# Patient Record
Sex: Female | Born: 2000 | Race: White | Hispanic: No | Marital: Single | Smoking: Current every day smoker
Health system: Southern US, Community
[De-identification: ages and names within clinical notes are randomized; demographics above are authoritative.]

## PROBLEM LIST (undated history)

## (undated) DIAGNOSIS — R55 Syncope and collapse: Secondary | ICD-10-CM

---
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
## 2019-01-26 ENCOUNTER — Emergency Department: Payer: Managed Care, Other (non HMO)

## 2019-01-26 ENCOUNTER — Other Ambulatory Visit: Payer: Self-pay

## 2019-01-26 ENCOUNTER — Emergency Department
Admission: EM | Admit: 2019-01-26 | Discharge: 2019-01-26 | Disposition: A | Discharge status: Home / Self Care | Payer: Managed Care, Other (non HMO) | Attending: Student in an Organized Health Care Education/Training Program | Admitting: Student in an Organized Health Care Education/Training Program

## 2019-01-26 DIAGNOSIS — R55 Syncope and collapse: Secondary | ICD-10-CM

## 2019-01-26 LAB — COMPREHENSIVE METABOLIC PANEL
ALBUMIN: 4.5 g/dL (ref 3.5–5.0)
ALT: 11 U/L (ref 0–44)
AST: 16 U/L (ref 15–41)
Alkaline Phosphatase: 86 U/L (ref 38–126)
Anion gap: 8 (ref 5–15)
BUN: 14 mg/dL (ref 6–20)
CO2: 22 mmol/L (ref 22–32)
Calcium: 9.5 mg/dL (ref 8.9–10.3)
Chloride: 107 mmol/L (ref 98–111)
Creatinine, Ser: 0.72 mg/dL (ref 0.44–1.00)
GFR calc Af Amer: >60 mL/min (ref >60)
GFR calc non Af Amer: >60 mL/min (ref >60)
GLUCOSE: 95 mg/dL (ref 70–99)
Potassium: 3.9 mmol/L (ref 3.5–5.1)
Sodium: 137 mmol/L (ref 135–145)
Total Bilirubin: 1.1 mg/dL (ref 0.3–1.2)
Total Protein: 7.6 g/dL (ref 6.5–8.1)

## 2019-01-26 LAB — CBC
HCT: 40.3 % (ref 36.0–46.0)
Hemoglobin: 14 g/dL (ref 12.0–15.0)
MCH: 29.4 pg (ref 26.0–34.0)
MCHC: 34.7 g/dL (ref 30.0–36.0)
MCV: 84.7 fL (ref 80.0–100.0)
Platelets: 301 10*3/uL (ref 150–400)
RBC: 4.76 MIL/uL (ref 3.87–5.11)
RDW: 11.9 % (ref 11.5–15.5)
WBC: 7.5 10*3/uL (ref 4.0–10.5)
nRBC: 0 % (ref 0.0–0.2)

## 2019-01-26 LAB — HCG, QUANTITATIVE, PREGNANCY: hCG, Beta Chain, Quant, S: <1 m[IU]/mL (ref <5)

## 2019-01-26 LAB — FIBRIN DERIVATIVES D-DIMER (ARMC ONLY): Fibrin derivatives D-dimer (ARMC): 161.85 ng/mL (FEU) (ref 0.00–499.00)

## 2019-01-26 IMAGING — CT CT HEAD WO/W CM
4 of 5 series · 15 of 47 positions shown, 17 images · IV contrast (omnipaque)
Comparison: None.

CLINICAL DATA: Multiple syncopal episodes in the past 24 hours.
Associated visual and hearing disturbance.

EXAM:
CT HEAD WITHOUT AND WITH CONTRAST
TECHNIQUE: Contiguous axial images were obtained from the base of the skull
through the vertex without and with intravenous contrast
CONTRAST:  75mL OMNIPAQUE IOHEXOL 300 MG/ML  SOLN

[Series 2: head wo · axial · 0.40mm/px · z∈[+54,+134]mm · 4 of 28 slices shown]
[im 6/28  brain]
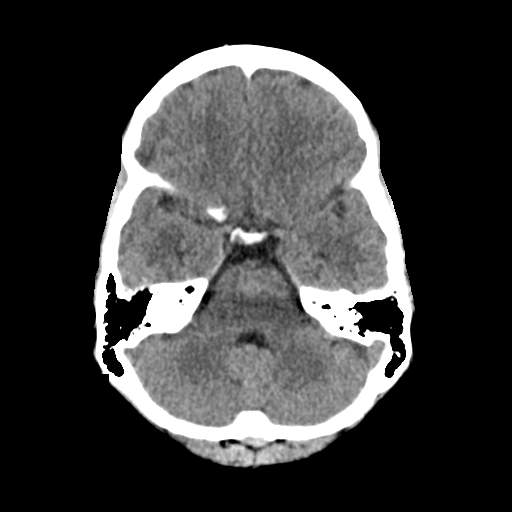
[im 11/28  brain]
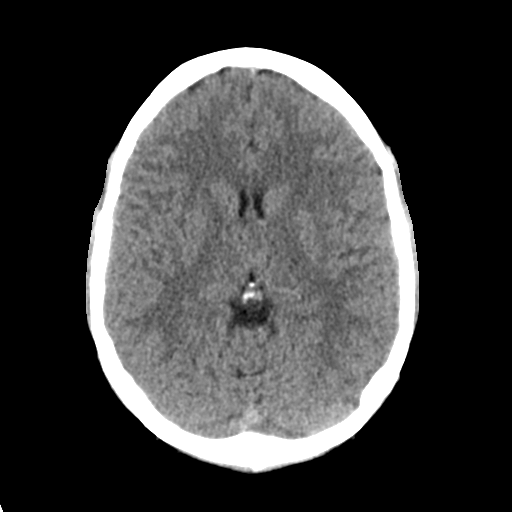
[im 17/28  brain]
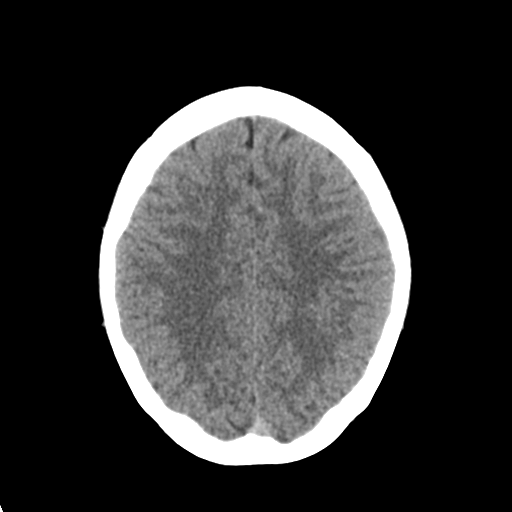
[im 22/28  brain]
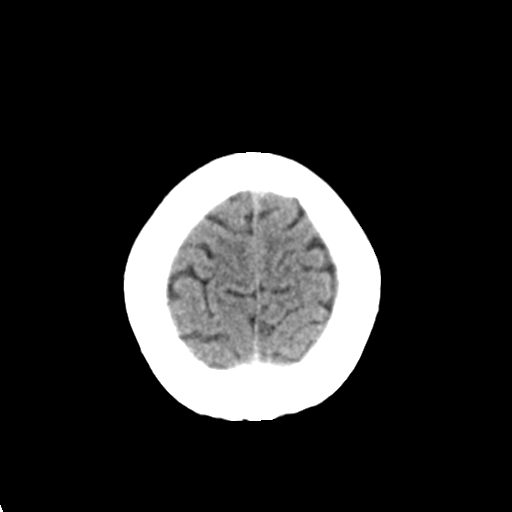

[Series 4: head w · axial · 0.40mm/px · z∈[+49,+139]mm · 5 of 28 slices shown, 7 images]
[im 5/28  brain]
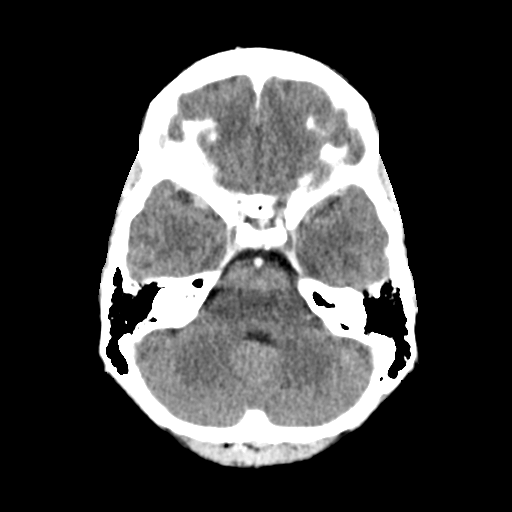
[im 5/28  bone]
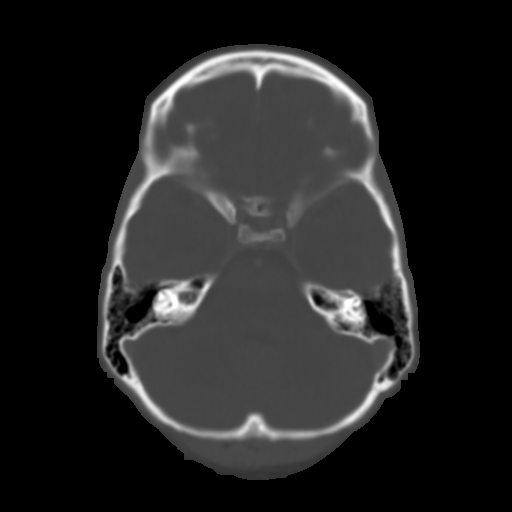
[im 10/28  brain]
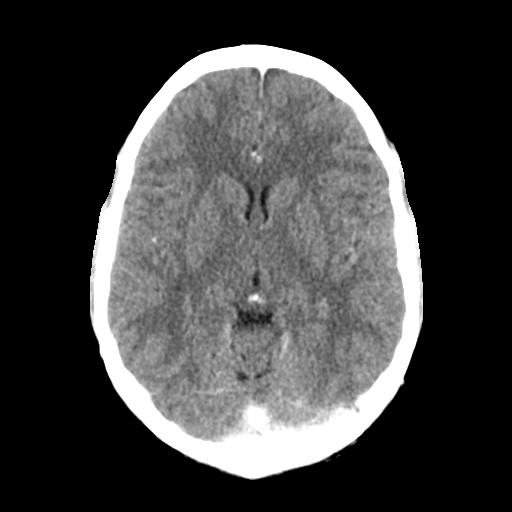
[im 14/28  brain]
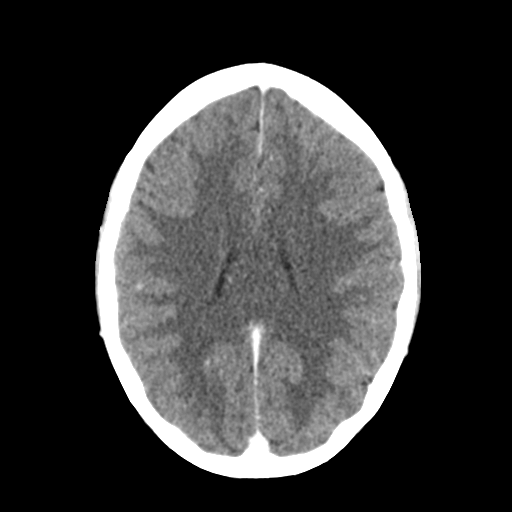
[im 19/28  brain]
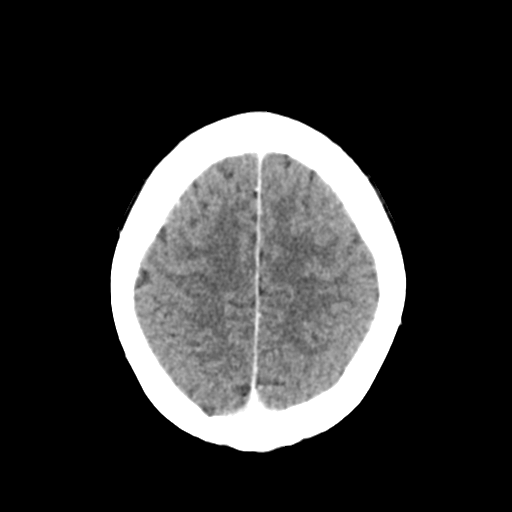
[im 23/28  brain]
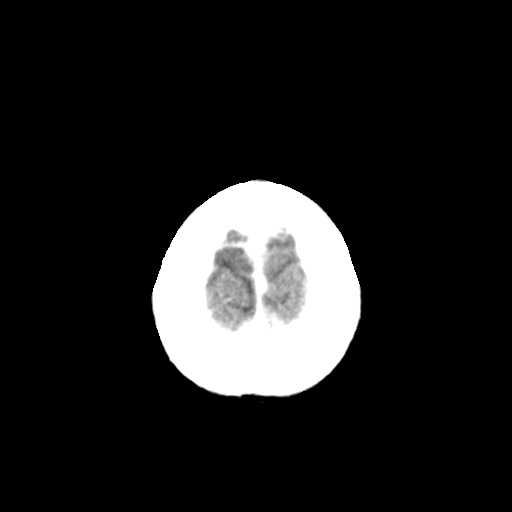
[im 23/28  bone]
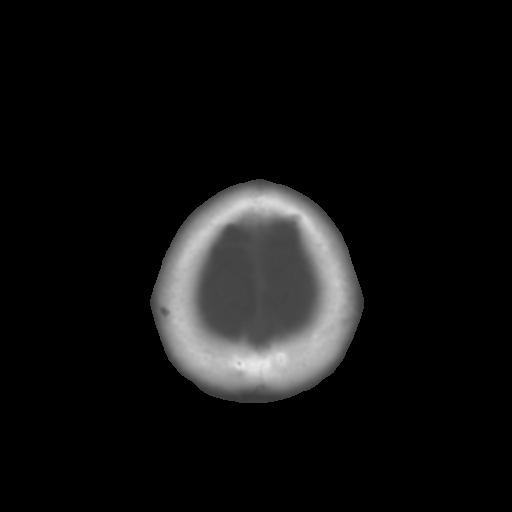

[Series 5: coronal soft tissue · coronal · 0.29mm/px · 3 of 65 slices shown]
[im 22/65  brain]
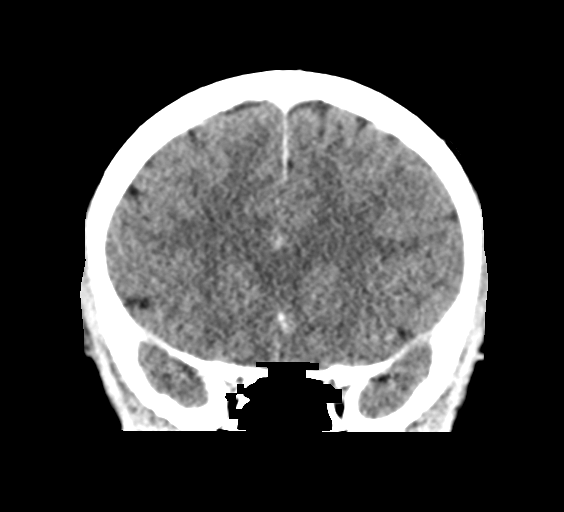
[im 29/65  brain]
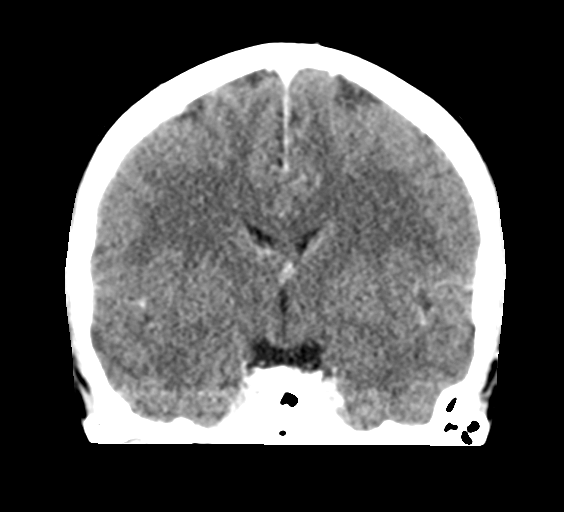
[im 36/65  brain]
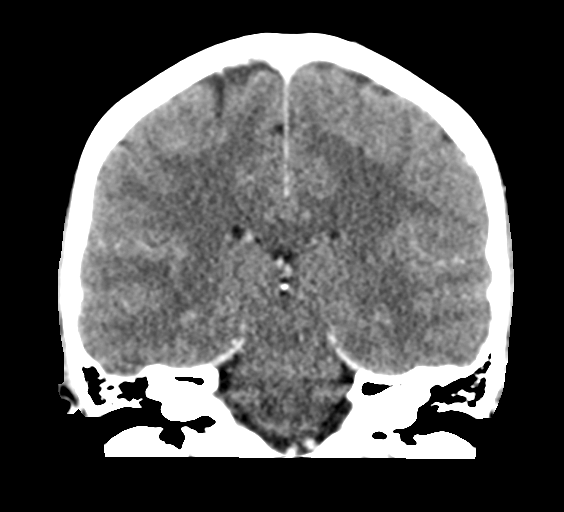

[Series 6: sagittal soft tissue · sagittal · 0.30mm/px · 3 of 50 slices shown]
[im 17/50  brain]
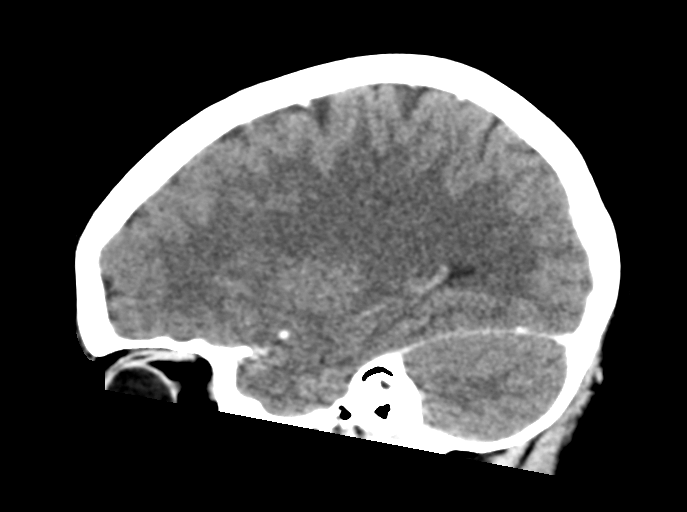
[im 25/50  brain]
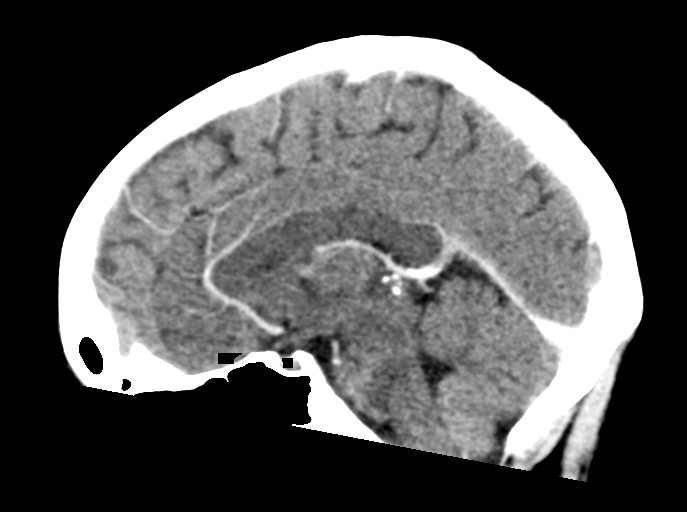
[im 33/50  brain]
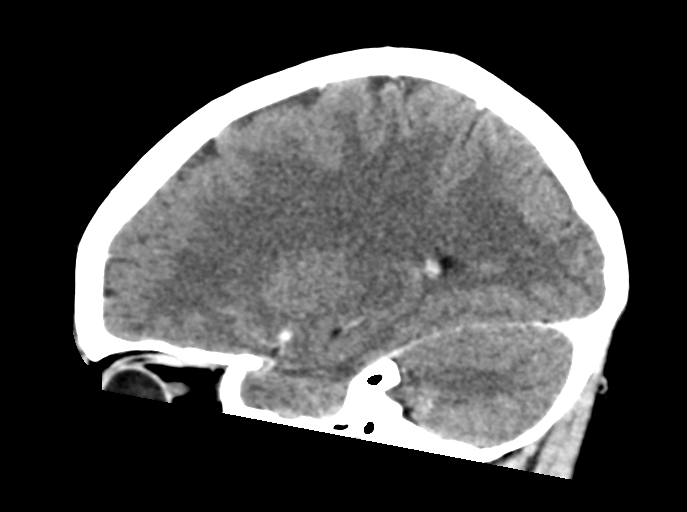

[15 of 47 positions shown; findings below may reference images not displayed]

FINDINGS: Brain: There is no evidence of acute infarct, intracranial
hemorrhage, mass, midline shift, or extra-axial fluid collection.
The ventricles and sulci are normal. No abnormal enhancement is
identified.

Vascular: Major dural venous sinuses and large arteries at the base
of the brain are grossly patent.

Skull: No fracture or focal osseous lesion.

Sinuses/Orbits: Visualized paranasal sinuses and mastoid air cells
are clear. Visualized orbits are unremarkable.

Other: None.
IMPRESSION: Unremarkable head CT.

## 2019-01-26 MED ORDER — IOHEXOL 300 MG/ML  SOLN
75.0000 mL | Freq: Once | INTRAMUSCULAR | Status: AC | PRN
  Administered 2019-01-26: 75 mL via INTRAVENOUS

## 2019-01-26 NOTE — ED Notes (Signed)
Pulse rate documented at 1200 is inaccurate-spo2 sensor has not been properly on finger at all times

## 2019-01-26 NOTE — ED Notes (Signed)
Pt states that she is depressed because her boyfriend, her only friend, broke up with her. Pt is from [employer].

## 2019-01-26 NOTE — ED Notes (Signed)
Patient transported to CT 

## 2019-01-26 NOTE — ED Triage Notes (Signed)
Pt arrived via [facility] from [facility]. with syncopal episode 4 times in the last 24 hours. Pt admits to being stressed lately and whenever it happens she has vision and hearing issues and then she wakes up. Pt is A&O x4.

## 2019-01-26 NOTE — ED Notes (Signed)
Pt alert and oriented, able to speak with clear speech. Pt recolects each syncopal episode. States that she is usually standing when this happends (not going from sit to stand) and she loses hearing and vision and her body goes to "jello" but that she falls "gracefully" and has not hit head

## 2019-01-26 NOTE — ED Provider Notes (Signed)
[facility] Emergency Department Provider Note    First MD Initiated Contact with Patient 01/26/19 1009     (approximate)  I have reviewed the triage vital signs and the nursing notes.   HISTORY  Chief Complaint Loss of Consciousness    HPI Katrina Butler is a 18 y.o. female presents the ER for evaluation of multiple fainting spells over the past 48 hours.  States she is had several episodes where she was otherwise feeling fine but then started feeling claustrophobic and weak and "weird feelings ".  States that she feels like her mind is racing and she feels very anxious.  She would not simply wake up after having fainting spell for on known duration with bystanders looking down at her.  Did not bite her tongue.  No loss of bladder control.  No reported shaking or seizure-like activity.  States that she has been getting headaches.  Does have a history of migraines but feel that they are happening more frequently.  Does have family history of neuro tumors.  She is on birth control.  Denies any chest pain.    History reviewed. No pertinent past medical history. History reviewed. No pertinent family history. History reviewed. No pertinent surgical history. There are no active problems to display for this patient.     Prior to Admission medications   Not on File    Allergies Amoxapine and related and Fruit & vegetable daily [nutritional supplements]    Social History Social History   Tobacco Use  . Smoking status: Not on file  Substance Use Topics  . Alcohol use: Not on file  . Drug use: Not on file    Review of Systems Patient denies headaches, rhinorrhea, blurry vision, numbness, shortness of breath, chest pain, edema, cough, abdominal pain, nausea, vomiting, diarrhea, dysuria, fevers, rashes or hallucinations unless otherwise stated above in HPI. ____________________________________________   PHYSICAL EXAM:  VITAL SIGNS: Vitals:   01/26/19 1130 01/26/19 1200  BP: 110/68 117/88  Pulse: (!) 53 (!) 36  Resp: (!) 9 15  Temp:    SpO2: 100% 95%    Constitutional: Alert and oriented. Anxious appearing  Eyes: Conjunctivae are normal.  Head: Atraumatic. Nose: No congestion/rhinnorhea. Mouth/Throat: Mucous membranes are moist.   Neck: No stridor. Painless ROM.  Cardiovascular: Normal rate, regular rhythm. Grossly normal heart sounds.  Good peripheral circulation. Respiratory: Normal respiratory effort.  No retractions. Lungs CTAB. Gastrointestinal: Soft and nontender. No distention. No abdominal bruits. No CVA tenderness. Genitourinary:  Musculoskeletal: No lower extremity tenderness nor edema.  No joint effusions. Neurologic:  Normal speech and language. No gross focal neurologic deficits are appreciated. No facial droop Skin:  Skin is warm, dry and intact. No rash noted. Psychiatric: Mood and affect are anxious. Speech and behavior are normal.  ____________________________________________   LABS (all labs ordered are listed, but only abnormal results are displayed)  Results for orders placed or performed during the hospital encounter of 01/26/19 (from the past 24 hour(s))  Comprehensive metabolic panel     Status: None   Collection Time: 01/26/19  9:51 AM  Result Value Ref Range   Sodium 137 135 - 145 mmol/L   Potassium 3.9 3.5 - 5.1 mmol/L   Chloride 107 98 - 111 mmol/L   CO2 22 22 - 32 mmol/L   Glucose, Bld 95 70 - 99 mg/dL   BUN 14 6 - 20 mg/dL   Creatinine, Ser 0.72 0.44 - 1.00 mg/dL   Calcium 9.5 8.9 -  10.3 mg/dL   Total Protein 7.6 6.5 - 8.1 g/dL   Albumin 4.5 3.5 - 5.0 g/dL   AST 16 15 - 41 U/L   ALT 11 0 - 44 U/L   Alkaline Phosphatase 86 38 - 126 U/L   Total Bilirubin 1.1 0.3 - 1.2 mg/dL   GFR calc non Af Amer >60 >60 mL/min   GFR calc Af Amer >60 >60 mL/min   Anion gap 8 5 - 15  CBC     Status: None   Collection Time: 01/26/19  9:51 AM  Result Value Ref Range   WBC 7.5 4.0 - 10.5 K/uL    RBC 4.76 3.87 - 5.11 MIL/uL   Hemoglobin 14.0 12.0 - 15.0 g/dL   HCT 40.3 36.0 - 46.0 %   MCV 84.7 80.0 - 100.0 fL   MCH 29.4 26.0 - 34.0 pg   MCHC 34.7 30.0 - 36.0 g/dL   RDW 11.9 11.5 - 15.5 %   Platelets 301 150 - 400 K/uL   nRBC 0.0 0.0 - 0.2 %  hCG, quantitative, pregnancy     Status: None   Collection Time: 01/26/19  9:51 AM  Result Value Ref Range   hCG, Beta Chain, Quant, S <1 <5 mIU/mL  Fibrin derivatives D-Dimer ([facility] only)     Status: None   Collection Time: 01/26/19 10:32 AM  Result Value Ref Range   Fibrin derivatives D-dimer ([facility]) 161.85 0.00 - 499.00 ng/mL (FEU)   ____________________________________________  EKG My review and personal interpretation at Time: 9:47   Indication: syncope  Rate: 55  Rhythm: sinus Axis: normal Other: normal intervals, no stemi ____________________________________________  RADIOLOGY  I personally reviewed all radiographic images ordered to evaluate for the above acute complaints and reviewed radiology reports and findings.  These findings were personally discussed with the patient.  Please see medical record for radiology report.  ____________________________________________   PROCEDURES  Procedure(s) performed:  Procedures    Critical Care performed: no ____________________________________________   INITIAL IMPRESSION / ASSESSMENT AND PLAN / ED COURSE  Pertinent labs & imaging results that were available during my care of the patient were reviewed by me and considered in my medical decision making (see chart for details).   DDX: dehydration, panic attack, anemia, electrolyte abn, pe, dysrhythmia  Katrina Butler is a 18 y.o. who presents to the ED with symptoms as described above.  Patient in no acute distress.  Neuro exam is nonfocal.  Will check blood work for above differential.  The patient will be placed on continuous pulse oximetry and telemetry for monitoring.  Laboratory evaluation will be sent to evaluate for the  above complaints.     Clinical Course as of Jan 26 1317  Wed Jan 26, 2019  1313 Reassessed.  Blood work is reassuring.  CT imaging shows no evidence of mass.  Does not seem clinically consistent with seizure.  No evidence of electrolyte abnormality.  There was one documented bradycardic episode however her telemetry rate was still in the 50s to 60s.  Likely incorrect reading from pulse oximetry   [PR]  1315 Will measure orthostatics and if normal will give referral to outpatient follow-up.  Have discussed with the patient and available family all diagnostics and treatments performed thus far and all questions were answered to the best of my ability. The patient demonstrates understanding and agreement with plan.    [PR]    Clinical Course User Index [PR] Merlyn Lot, MD     As part of  my medical decision making, I reviewed the following data within the [place] notes reviewed and incorporated, Labs reviewed, notes from prior ED visits and  Controlled Substance Database   ____________________________________________   FINAL CLINICAL IMPRESSION(S) / ED DIAGNOSES  Final diagnoses:  Fainting spell      NEW MEDICATIONS STARTED DURING THIS VISIT:  New Prescriptions   No medications on file     Note:  This document was prepared using Dragon voice recognition software and may include unintentional dictation errors.    Merlyn Lot, MD 01/26/19 1318

## 2019-11-02 ENCOUNTER — Other Ambulatory Visit: Payer: Self-pay

## 2019-11-02 DIAGNOSIS — Z20822 Contact with and (suspected) exposure to covid-19: Secondary | ICD-10-CM

## 2019-11-04 LAB — NOVEL CORONAVIRUS, NAA: SARS-CoV-2, NAA: NOT DETECTED

## 2020-03-12 ENCOUNTER — Ambulatory Visit: Payer: Managed Care, Other (non HMO) | Attending: Internal Medicine

## 2020-03-12 DIAGNOSIS — Z23 Encounter for immunization: Secondary | ICD-10-CM

## 2020-03-12 NOTE — Progress Notes (Signed)
   Covid-19 Vaccination Clinic  Name:  Katrina Butler    MRN: [id number] DOB: 20-Feb-2001  03/12/2020  [name] was observed post Covid-19 immunization for 15 minutes without incident. She was provided with Vaccine Information Sheet and instruction to access the V-Safe system.   [name] was instructed to call 911 with any severe reactions post vaccine: Marland Kitchen Difficulty breathing  . Swelling of face and throat  . A fast heartbeat  . A bad rash all over body  . Dizziness and weakness   Immunizations Administered    Name Date Dose VIS Date Route   Pfizer COVID-19 Vaccine 03/12/2020  2:19 PM 0.3 mL 11/25/2019 Intramuscular   Manufacturer: [place]   Lot: [id number]   [place]: [id number]

## 2020-04-02 ENCOUNTER — Ambulatory Visit: Payer: Managed Care, Other (non HMO) | Attending: Internal Medicine

## 2020-04-02 DIAGNOSIS — Z23 Encounter for immunization: Secondary | ICD-10-CM

## 2020-04-02 NOTE — Progress Notes (Signed)
   Covid-19 Vaccination Clinic  Name:  Katrina Butler    MRN: [id number] DOB: 10/13/2001  04/02/2020  [name] was observed post Covid-19 immunization for 15 minutes without incident. She was provided with Vaccine Information Sheet and instruction to access the V-Safe system.   [name] was instructed to call 911 with any severe reactions post vaccine: Marland Kitchen Difficulty breathing  . Swelling of face and throat  . A fast heartbeat  . A bad rash all over body  . Dizziness and weakness   Immunizations Administered    Name Date Dose VIS Date Route   Pfizer COVID-19 Vaccine 04/02/2020  2:15 PM 0.3 mL 02/08/2019 Intramuscular   Manufacturer: [employer], [employer]   Lot: [id number]   [place]: [id number]

## 2021-02-17 ENCOUNTER — Emergency Department
Admission: EM | Admit: 2021-02-17 | Discharge: 2021-02-17 | Disposition: A | Discharge status: Home / Self Care | Payer: Managed Care, Other (non HMO) | Attending: Emergency Medicine | Admitting: Emergency Medicine

## 2021-02-17 ENCOUNTER — Other Ambulatory Visit: Payer: Self-pay

## 2021-02-17 ENCOUNTER — Emergency Department: Payer: Managed Care, Other (non HMO)

## 2021-02-17 ENCOUNTER — Encounter: Payer: Self-pay | Admitting: Intensive Care

## 2021-02-17 DIAGNOSIS — Z20822 Contact with and (suspected) exposure to covid-19: Secondary | ICD-10-CM | POA: Diagnosis not present

## 2021-02-17 DIAGNOSIS — R1031 Right lower quadrant pain: Secondary | ICD-10-CM | POA: Insufficient documentation

## 2021-02-17 DIAGNOSIS — R112 Nausea with vomiting, unspecified: Secondary | ICD-10-CM | POA: Insufficient documentation

## 2021-02-17 DIAGNOSIS — R1084 Generalized abdominal pain: Secondary | ICD-10-CM

## 2021-02-17 DIAGNOSIS — R197 Diarrhea, unspecified: Secondary | ICD-10-CM | POA: Diagnosis not present

## 2021-02-17 DIAGNOSIS — F17290 Nicotine dependence, other tobacco product, uncomplicated: Secondary | ICD-10-CM | POA: Insufficient documentation

## 2021-02-17 HISTORY — DX: Syncope and collapse: R55

## 2021-02-17 LAB — URINALYSIS, COMPLETE (UACMP) WITH MICROSCOPIC
Bacteria, UA: NONE SEEN
Bilirubin Urine: NEGATIVE
Glucose, UA: NEGATIVE mg/dL
Ketones, ur: 5 mg/dL — AB
Leukocytes,Ua: NEGATIVE
Nitrite: NEGATIVE
Protein, ur: NEGATIVE mg/dL
Specific Gravity, Urine: 1.025 (ref 1.005–1.030)
pH: 5 (ref 5.0–8.0)

## 2021-02-17 LAB — GASTROINTESTINAL PANEL BY PCR, STOOL (REPLACES STOOL CULTURE)

## 2021-02-17 LAB — COMPREHENSIVE METABOLIC PANEL
ALT: 26 U/L (ref 0–44)
AST: 31 U/L (ref 15–41)
Albumin: 4.7 g/dL (ref 3.5–5.0)
Alkaline Phosphatase: 106 U/L (ref 38–126)
Anion gap: 11 (ref 5–15)
BUN: 9 mg/dL (ref 6–20)
CO2: 18 mmol/L — ABNORMAL LOW (ref 22–32)
Calcium: 9.6 mg/dL (ref 8.9–10.3)
Chloride: 111 mmol/L (ref 98–111)
Creatinine, Ser: 0.69 mg/dL (ref 0.44–1.00)
GFR, Estimated: >60 mL/min (ref >60)
Glucose, Bld: 99 mg/dL (ref 70–99)
Potassium: 4.2 mmol/L (ref 3.5–5.1)
Sodium: 140 mmol/L (ref 135–145)
Total Bilirubin: 1 mg/dL (ref 0.3–1.2)
Total Protein: 7.8 g/dL (ref 6.5–8.1)

## 2021-02-17 LAB — CBC
HCT: 45.6 % (ref 36.0–46.0)
Hemoglobin: 16.1 g/dL — ABNORMAL HIGH (ref 12.0–15.0)
MCH: 30.6 pg (ref 26.0–34.0)
MCHC: 35.3 g/dL (ref 30.0–36.0)
MCV: 86.7 fL (ref 80.0–100.0)
Platelets: 305 10*3/uL (ref 150–400)
RBC: 5.26 MIL/uL — ABNORMAL HIGH (ref 3.87–5.11)
RDW: 12 % (ref 11.5–15.5)
WBC: 17 10*3/uL — ABNORMAL HIGH (ref 4.0–10.5)
nRBC: 0 % (ref 0.0–0.2)

## 2021-02-17 LAB — C DIFFICILE QUICK SCREEN W PCR REFLEX
C Diff antigen: NEGATIVE
C Diff interpretation: NOT DETECTED
C Diff toxin: NEGATIVE

## 2021-02-17 LAB — LIPASE, BLOOD: Lipase: 29 U/L (ref 11–51)

## 2021-02-17 LAB — POC URINE PREG, ED: Preg Test, Ur: NEGATIVE

## 2021-02-17 MED ORDER — ONDANSETRON HCL 4 MG/2ML IJ SOLN
4.0000 mg | Freq: Once | INTRAMUSCULAR | Status: AC
  Administered 2021-02-17: 4 mg via INTRAVENOUS
  Filled 2021-02-17: qty 2

## 2021-02-17 MED ORDER — IOHEXOL 300 MG/ML  SOLN
100.0000 mL | Freq: Once | INTRAMUSCULAR | Status: AC | PRN
  Administered 2021-02-17: 100 mL via INTRAVENOUS

## 2021-02-17 MED ORDER — HYDROMORPHONE HCL 1 MG/ML IJ SOLN
0.5000 mg | Freq: Once | INTRAMUSCULAR | Status: AC
  Administered 2021-02-17: 0.5 mg via INTRAVENOUS

## 2021-02-17 MED ORDER — IOHEXOL 9 MG/ML PO SOLN
500.0000 mL | Freq: Once | ORAL | Status: DC | PRN
  Administered 2021-02-17: 500 mL via ORAL

## 2021-02-17 MED ORDER — HYDROCODONE-ACETAMINOPHEN 5-325 MG PO TABS: 1.0000 | ORAL_TABLET | ORAL | 0 refills | Status: AC | PRN

## 2021-02-17 MED ORDER — OXYCODONE-ACETAMINOPHEN 5-325 MG PO TABS
1.0000 | ORAL_TABLET | Freq: Once | ORAL | Status: AC
  Administered 2021-02-17: 1 via ORAL
  Filled 2021-02-17: qty 1

## 2021-02-17 MED ORDER — HYDROMORPHONE HCL 1 MG/ML IJ SOLN
1.0000 mg | Freq: Once | INTRAMUSCULAR | Status: DC
  Filled 2021-02-17: qty 1

## 2021-02-17 MED ORDER — SODIUM CHLORIDE 0.9 % IV BOLUS
1000.0000 mL | Freq: Once | INTRAVENOUS | Status: AC
  Administered 2021-02-17: 1000 mL via INTRAVENOUS

## 2021-02-17 NOTE — ED Triage Notes (Signed)
Patient c/o excruciating abdominal pain X1 month with nausea/vomiting. C/o diarrhea X1 week

## 2021-02-17 NOTE — ED Provider Notes (Signed)
[facility] Emergency Department Provider Note  Time seen: 4:12 PM  I have reviewed the triage vital signs and the nursing notes.   HISTORY  Chief Complaint Abdominal Pain   HPI Katrina Butler is a 20 y.o. female with no significant past medical history presents to the emergency department for abdominal pain.  According to the patient for the past 1 month she has been experiencing abdominal pain that has progressively worsened and is now severe and diffuse per patient.  States nausea and vomiting today.  Has had diarrhea for the past 3 to 4 weeks per patient.  Sometimes multiple times per day.  Patient denies any focal area of her abdomen that hurts states it is fairly diffuse, sometimes it appears more upper currently it appears more lower.  Denies any dysuria or hematuria.  No vaginal bleeding or discharge.  No family history of inflammatory bowel disease.   No prior abdominal surgeries.  Past Medical History:  Diagnosis Date  . Vasovagal syncope     There are no problems to display for this patient.   History reviewed. No pertinent surgical history.  Prior to Admission medications   Not on File    Allergies  Allergen Reactions  . Amoxapine And Related   . Amoxicillin   . Fruit & Vegetable Daily [Nutritional Supplements]     History reviewed. No pertinent family history.  Social History Social History   Tobacco Use  . Smoking status: Current Every Day Smoker    Types: E-cigarettes  . Smokeless tobacco: Never Used  Vaping Use  . Vaping Use: Every day  Substance Use Topics  . Alcohol use: Yes    Comment: occ  . Drug use: Yes    Types: Marijuana    Review of Systems Constitutional: Negative for fever. Cardiovascular: Negative for chest pain. Respiratory: Negative for shortness of breath. Gastrointestinal: Positive for abdominal pain x1 month.  Nausea vomiting this morning.  Diarrhea x1 month. Genitourinary: Negative for urinary  compaints Musculoskeletal: Negative for musculoskeletal complaints Neurological: Negative for headache All other ROS negative  ____________________________________________   PHYSICAL EXAM:  VITAL SIGNS: ED Triage Vitals [02/17/21 1352]  Enc Vitals Group     BP (!) 110/95     Pulse Rate 99     Resp 16     Temp 98.5 F (36.9 C)     Temp Source Oral     SpO2 96 %     Weight 160 lb (72.6 kg)     Height 5\' 9"  (1.753 m)     Head Circumference      Peak Flow      Pain Score 9     Pain Loc      Pain Edu?      Excl. in [place]?     Constitutional: Alert and oriented. Well appearing and in no distress. Eyes: Normal exam ENT      Head: Normocephalic and atraumatic.      Mouth/Throat: Mucous membranes are moist. Cardiovascular: Normal rate, regular rhythm. Respiratory: Normal respiratory effort without tachypnea nor retractions. Breath sounds are clear Gastrointestinal: Soft, mild diffuse tenderness, somewhat increased in the right lower quadrant.  No rebound guarding or distention Musculoskeletal: Nontender with normal range of motion in all extremities.  Neurologic:  Normal speech and language. No gross focal neurologic deficits  Skin:  Skin is warm, dry and intact.  Psychiatric: Mood and affect are normal.   ____________________________________________    RADIOLOGY  CT is negative for acute  abnormality  ____________________________________________   INITIAL IMPRESSION / ASSESSMENT AND PLAN / ED COURSE  Pertinent labs & imaging results that were available during my care of the patient were reviewed by me and considered in my medical decision making (see chart for details).   Patient presents emergency department for diffuse abdominal pain x1 month along with diarrhea x1 month.  Patient does have mild to moderate diffuse tenderness over the abdomen somewhat more so on the right lower quadrant.  Patient's white blood cell count is 17,000.  Lab work is otherwise overall  reassuring.  We will treat pain nausea and IV hydrate.  Will obtain CT imaging to further evaluate.  Patient agreeable to plan of care.  CT negative for acute abnormality.  Patient believes she could provide a stool sample for bio fire/C. difficile testing.  Patient does not wish to wait for the results.  She will follow up with her PCP.  We will provide a very short course of pain medication for the patient.  Katrina Butler was evaluated in Emergency Department on 02/17/2021 for the symptoms described in the history of present illness. She was evaluated in the context of the global COVID-19 pandemic, which necessitated consideration that the patient might be at risk for infection with the SARS-CoV-2 virus that causes COVID-19. Institutional protocols and algorithms that pertain to the evaluation of patients at risk for COVID-19 are in a state of rapid change based on information released by regulatory bodies including the CDC and federal and state organizations. These policies and algorithms were followed during the patient's care in the ED.  ____________________________________________   FINAL CLINICAL IMPRESSION(S) / ED DIAGNOSES  Abdominal pain Diarrhea   Harvest Dark, MD 02/17/21 1725

## 2021-02-18 LAB — CBC AND DIFFERENTIAL
HCT: 42 (ref 36–46)
Hemoglobin: 14.5 (ref 12.0–16.0)
Neutrophils Absolute: 4.8
Platelets: 250 (ref 150–399)
WBC: 6.6

## 2021-02-18 LAB — CBC: RBC: 4.83 (ref 3.87–5.11)

## 2021-03-08 ENCOUNTER — Other Ambulatory Visit: Payer: Self-pay

## 2021-03-11 ENCOUNTER — Ambulatory Visit: Payer: Managed Care, Other (non HMO) | Admitting: Nurse Practitioner
